# Patient Record
Sex: Female | Born: 1995 | Race: Black or African American | Hispanic: No | Marital: Single | Smoking: Never smoker
Health system: Southern US, Community
[De-identification: ages and names within clinical notes are randomized; demographics above are authoritative.]

---

## 2017-10-13 ENCOUNTER — Encounter (HOSPITAL_COMMUNITY): Payer: Self-pay

## 2017-10-13 DIAGNOSIS — Z5321 Procedure and treatment not carried out due to patient leaving prior to being seen by health care provider: Secondary | ICD-10-CM | POA: Insufficient documentation

## 2017-10-13 DIAGNOSIS — R0602 Shortness of breath: Secondary | ICD-10-CM | POA: Insufficient documentation

## 2017-10-13 LAB — BASIC METABOLIC PANEL
Anion gap: 9 (ref 5–15)
BUN: 10 mg/dL (ref 6–20)
CHLORIDE: 109 mmol/L (ref 101–111)
CO2: 21 mmol/L — AB (ref 22–32)
Calcium: 8.9 mg/dL (ref 8.9–10.3)
Creatinine, Ser: 0.87 mg/dL (ref 0.44–1.00)
GFR calc Af Amer: >60 mL/min (ref >60)
GFR calc non Af Amer: >60 mL/min (ref >60)
GLUCOSE: 98 mg/dL (ref 65–99)
Potassium: 3.6 mmol/L (ref 3.5–5.1)
Sodium: 139 mmol/L (ref 135–145)

## 2017-10-13 LAB — I-STAT BETA HCG BLOOD, ED (MC, WL, AP ONLY)

## 2017-10-13 LAB — CBC
HCT: 34.7 % — ABNORMAL LOW (ref 36.0–46.0)
Hemoglobin: 11.1 g/dL — ABNORMAL LOW (ref 12.0–15.0)
MCH: 30.9 pg (ref 26.0–34.0)
MCHC: 32 g/dL (ref 30.0–36.0)
MCV: 96.7 fL (ref 78.0–100.0)
Platelets: 286 10*3/uL (ref 150–400)
RBC: 3.59 MIL/uL — AB (ref 3.87–5.11)
RDW: 12.7 % (ref 11.5–15.5)
WBC: 5.1 10*3/uL (ref 4.0–10.5)

## 2017-10-13 MED ORDER — ALBUTEROL SULFATE (2.5 MG/3ML) 0.083% IN NEBU: 5.0000 mg | INHALATION_SOLUTION | Freq: Once | RESPIRATORY_TRACT | Status: DC

## 2017-10-13 NOTE — ED Triage Notes (Signed)
Pt doesn't take any birth control

## 2017-10-13 NOTE — ED Triage Notes (Signed)
Pt complains of being short of breath after diving this weekend, the dive center wanted her to come get checked out because of her shortness of breath Pt also complains of left leg pain, ear ringing and some back pain

## 2017-10-14 ENCOUNTER — Emergency Department (HOSPITAL_COMMUNITY)
Admission: EM | Admit: 2017-10-14 | Discharge: 2017-10-14 | Disposition: A | Discharge status: Home / Self Care | Payer: Medicaid - Out of State | Attending: Emergency Medicine | Admitting: Emergency Medicine

## 2017-10-14 NOTE — ED Notes (Signed)
Called pt from lobby for recheck of v/s No response 1st attempt 

## 2017-10-15 ENCOUNTER — Emergency Department (HOSPITAL_COMMUNITY): Payer: Medicaid - Out of State

## 2017-10-15 ENCOUNTER — Emergency Department (HOSPITAL_COMMUNITY)
Admission: EM | Admit: 2017-10-15 | Discharge: 2017-10-15 | Disposition: A | Discharge status: Home / Self Care | Payer: Medicaid - Out of State | Attending: Emergency Medicine | Admitting: Emergency Medicine

## 2017-10-15 ENCOUNTER — Encounter (HOSPITAL_COMMUNITY): Payer: Self-pay | Admitting: *Deleted

## 2017-10-15 DIAGNOSIS — R42 Dizziness and giddiness: Secondary | ICD-10-CM | POA: Insufficient documentation

## 2017-10-15 DIAGNOSIS — M545 Low back pain: Secondary | ICD-10-CM | POA: Diagnosis not present

## 2017-10-15 DIAGNOSIS — M25552 Pain in left hip: Secondary | ICD-10-CM | POA: Insufficient documentation

## 2017-10-15 DIAGNOSIS — R0602 Shortness of breath: Secondary | ICD-10-CM | POA: Diagnosis not present

## 2017-10-15 DIAGNOSIS — R0789 Other chest pain: Secondary | ICD-10-CM | POA: Insufficient documentation

## 2017-10-15 DIAGNOSIS — M25562 Pain in left knee: Secondary | ICD-10-CM | POA: Insufficient documentation

## 2017-10-15 DIAGNOSIS — R600 Localized edema: Secondary | ICD-10-CM | POA: Insufficient documentation

## 2017-10-15 DIAGNOSIS — H9319 Tinnitus, unspecified ear: Secondary | ICD-10-CM | POA: Insufficient documentation

## 2017-10-15 DIAGNOSIS — R079 Chest pain, unspecified: Secondary | ICD-10-CM

## 2017-10-15 LAB — CBC
HCT: 34.1 % — ABNORMAL LOW (ref 36.0–46.0)
Hemoglobin: 11 g/dL — ABNORMAL LOW (ref 12.0–15.0)
MCH: 31.3 pg (ref 26.0–34.0)
MCHC: 32.3 g/dL (ref 30.0–36.0)
MCV: 97.2 fL (ref 78.0–100.0)
PLATELETS: 282 10*3/uL (ref 150–400)
RBC: 3.51 MIL/uL — AB (ref 3.87–5.11)
RDW: 12.7 % (ref 11.5–15.5)
WBC: 4.3 10*3/uL (ref 4.0–10.5)

## 2017-10-15 LAB — COMPREHENSIVE METABOLIC PANEL
ALT: 12 U/L — ABNORMAL LOW (ref 14–54)
AST: 22 U/L (ref 15–41)
Albumin: 3.6 g/dL (ref 3.5–5.0)
Alkaline Phosphatase: 47 U/L (ref 38–126)
Anion gap: 5 (ref 5–15)
BILIRUBIN TOTAL: 0.4 mg/dL (ref 0.3–1.2)
BUN: 10 mg/dL (ref 6–20)
CO2: 26 mmol/L (ref 22–32)
CREATININE: 0.77 mg/dL (ref 0.44–1.00)
Calcium: 8.7 mg/dL — ABNORMAL LOW (ref 8.9–10.3)
Chloride: 110 mmol/L (ref 101–111)
Glucose, Bld: 106 mg/dL — ABNORMAL HIGH (ref 65–99)
Potassium: 4 mmol/L (ref 3.5–5.1)
Sodium: 141 mmol/L (ref 135–145)
TOTAL PROTEIN: 6.6 g/dL (ref 6.5–8.1)

## 2017-10-15 LAB — TROPONIN I

## 2017-10-15 MED ORDER — DIPHENHYDRAMINE HCL 25 MG PO CAPS
25.0000 mg | ORAL_CAPSULE | Freq: Once | ORAL | Status: AC
  Administered 2017-10-15: 25 mg via ORAL
  Filled 2017-10-15: qty 1

## 2017-10-15 MED ORDER — KETOROLAC TROMETHAMINE 30 MG/ML IJ SOLN
30.0000 mg | Freq: Once | INTRAMUSCULAR | Status: AC
  Administered 2017-10-15: 30 mg via INTRAVENOUS
  Filled 2017-10-15: qty 1

## 2017-10-15 MED ORDER — SODIUM CHLORIDE 0.9 % IV SOLN: Freq: Once | INTRAVENOUS | Status: DC

## 2017-10-15 NOTE — ED Triage Notes (Signed)
Pt complains of chest pain, shortness of breath, left leg pain and swelling, head pain. Pt was diving over the weekend and dive center wanted pt to be evaluated. Pt came to ED yesterday, had EKG and blood drawn but left before being seen.

## 2017-10-15 NOTE — ED Provider Notes (Addendum)
[facility]-EMERGENCY DEPT Provider Note   CSN: [id number] Arrival date & time: 10/15/17  1036     History   Chief Complaint Chief Complaint  Patient presents with  . Chest Pain    HPI Bonnie Good is a 22 y.o. female. With no known past medical history presents with sob, left leg pain pain, ear ringing, and back pain. The patient went scuba diving on 4/27 and 4/28 for a total of 4 dives (2 dives each day) that decended below 30 feet. She was being supervised by her diving instructors and to her knowledge her tank and mask were functional. She also states that she feels that she ascended to surface level at appropriate time and was being supervised by her instructors at the time. She stayed at surface level for 10-12min between her dives as instructed.   Within 2 hrs after the dive the patient mentioned that she started to be lightheaded and dizzy. She developed head throbbing which later became a pressure like pain. Later on in the day, she started to get left hip and knee pain that was 4/10 pain and sharp in nature, and worsens when she walks. She also developed substernal chest pain that is 7/10 intensity, nonspecific in nature, worsening with changes in position, and alleviated with deep breaths. She had tinnitus as well. Initially the patient felt that she was just being anxious, but as her symptoms have persisted and have been worsening she called the divers hotline and was recommended to come to the ED to be evaluated.   Presented to ED on 10/13/17, but left before being seen by physician. EKG done at that time did not show any st or t wave changes. No significant electrolyte abnormalities. No hemoconcentration (hb=11.1, hct=34.7).    History reviewed. No pertinent past medical history.  There are no active problems to display for this patient.   History reviewed. No pertinent surgical history.   OB History   None      Home Medications    Prior to  Admission medications   Medication Sig Start Date End Date Taking? Authorizing Provider  BIOTIN PO Take 1 capsule by mouth every 7 (seven) days.   Yes [provider]  ibuprofen (ADVIL,MOTRIN) 200 MG tablet Take 200 mg by mouth every 6 (six) hours as needed for moderate pain.   Yes [provider]    Family History No family history on file.  Social History Social History   Tobacco Use  . Smoking status: Never Smoker  . Smokeless tobacco: Never Used  Substance Use Topics  . Alcohol use: Never    Frequency: Never  . Drug use: Never     Allergies   Patient has no known allergies.   Review of Systems Review of Systems  Constitutional: Positive for fatigue.  HENT: Positive for tinnitus.   Cardiovascular: Positive for chest pain.  Musculoskeletal: Positive for joint swelling.  Neurological: Positive for dizziness and light-headedness.   Physical Exam Updated Vital Signs BP 122/80 (BP Location: Left Arm)   Pulse (!) 102   Temp 98.3 F (36.8 C) (Oral)   Resp 18   LMP 10/12/2017   SpO2 100%   Physical Exam  Constitutional: She appears well-developed and well-nourished. She does not appear ill.  HENT:  Head: Normocephalic and atraumatic.  Eyes: Pupils are equal, round, and reactive to light.  Cardiovascular: Normal pulses. Exam reveals no S3 and no S4.  No murmur heard. Pulmonary/Chest: Effort normal and breath sounds  normal. No accessory muscle usage. No respiratory distress.  Musculoskeletal:       Right lower leg: She exhibits no tenderness and no edema.       Left lower leg: She exhibits edema (posterior left knee joint).  Neurological:  5/5 muscle strength in bilateral upper and lower extremity  Skin: No erythema.  Psychiatric: She has a normal mood and affect. Her behavior is normal. Her mood appears not anxious. She is not agitated.    ED Treatments / Results  Labs (all labs ordered are listed, but only abnormal results are  displayed) Labs Reviewed  BASIC METABOLIC PANEL  CBC  TROPONIN I    EKG None  Radiology No results found.  Procedures Procedures (including critical care time)  Medications Ordered in ED Medications - No data to display   Initial Impression / Assessment and Plan / ED Course  I have reviewed the triage vital signs and the nursing notes.  Pertinent labs & imaging results that were available during my care of the patient were reviewed by me and considered in my medical decision making (see chart for details).  Patient has tinnitus, lightheadedness/dizziness, musculoskeletal/joint pain (left hip and left knee), headache, chest pain, shortness of breath that all meet criteria for a clinical diagnosis of decompression sickness. She scored a 5 based on [place] [employer] (SANDHOG) scale for decompression sickness.  Called [facility] and spoke to Dr. Ulice Brilliant about treatment plan. She mentioned that the patient likely just has some increased nitrogen which should be alleviated. Did not believe that the patient truly has decompression sickness. Was instructed to give 100% oxygen via non-rebreather and do tandem walking test before discharging patient.  -CBC did not show any hemoconcentration (Hb=11.0, hct=34.1) -Placed on 100% oxygen via non-rebreather -Chest xray shows no acute cardiopulmonary abnormalities.  -Left knee xray without any effusion, fracture, dislocation -Left hip xray normal without any effusion, fraction or dislocation -CT head no acute intracranial abnormalities -Passed tandem walking test forwards and backwards -Gave toradol , benadryl   Final Clinical Impressions(s) / ED Diagnoses   Final diagnoses:  None    ED Discharge Orders    None       Lorenso Courier, MD 10/15/17 1851    Lorenso Courier, MD 10/15/17 2001    Marily Memos, MD 10/15/17 2326

## 2017-10-15 NOTE — ED Notes (Signed)
Pt aware we are waiting for EDP to come discuss test results and discharge or POC

## 2017-10-15 NOTE — ED Provider Notes (Signed)
I saw and evaluated the patient, reviewed the resident's note and I agree with the findings and plan with the following exceptions.   22 yo F w/ 4 dives over the weekend >30 feet subsequently has had headache, neck pain, chest pain w/ sob, tinnitus, left knee pain/swelling since that time. Hasn't taken anything for pain. No h/o cad, PE, dvt,aneurysms.  Exam with slight tachycardia, mild swelling around left knee. Neuro intact. Abdomen benign.  Has many criteria for possible decompression sickness, will consult hyperbaric chamber. Will ct/ecg/cxr/labs while waiting consultation.    EKG Interpretation  Date/Time:  Wednesday Oct 15 2017 16:50:21 EDT Ventricular Rate:  52 PR Interval:    QRS Duration: 97 QT Interval:  426 QTC Calculation: 397 R Axis:   52 Text Interpretation:  Sinus rhythm Probable left atrial enlargement No significant change since last tracing Confirmed by Marily Memos [phone]) on 10/15/2017 6:37:02 PM         Deette Revak, Barbara Cower, MD 10/15/17 2326

## 2017-10-15 NOTE — Discharge Instructions (Addendum)
It was a pleasure to take care of you Bonnie Good. You were evaluated for decompensation sickness after your recent scuba diving. Xrays of your left hip and left knee, CT of head, and chest xray were all normal. Please continue to monitor your symptoms and if you continue to have any issues please call the [facility].

## 2017-10-15 NOTE — ED Notes (Signed)
Pt started on 100% NRB as ordered. No s/s of distress or complaints at present time

## 2019-08-02 IMAGING — CT CT HEAD W/O CM
3 series · 16 of 47 positions shown, 19 images · non-contrast
Comparison: None.

CLINICAL DATA: Headache

EXAM:
CT HEAD WITHOUT CONTRAST
TECHNIQUE: Contiguous axial images were obtained from the base of the skull
through the vertex without intravenous contrast.

[Series 2: head wo · axial · 0.41mm/px · z∈[-161,-26]mm · 10 of 33 slices shown, 13 images]
[im 3/33  brain]
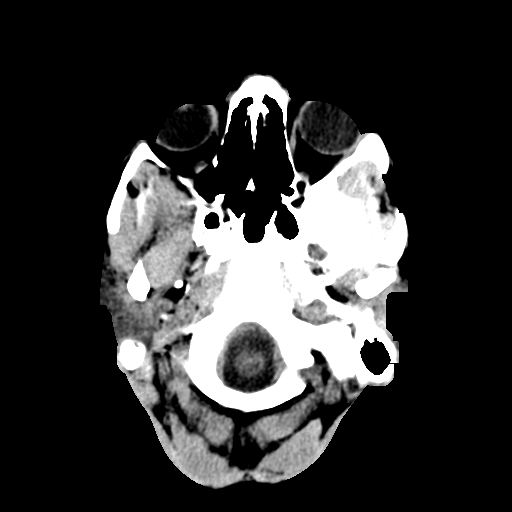
[im 3/33  bone]
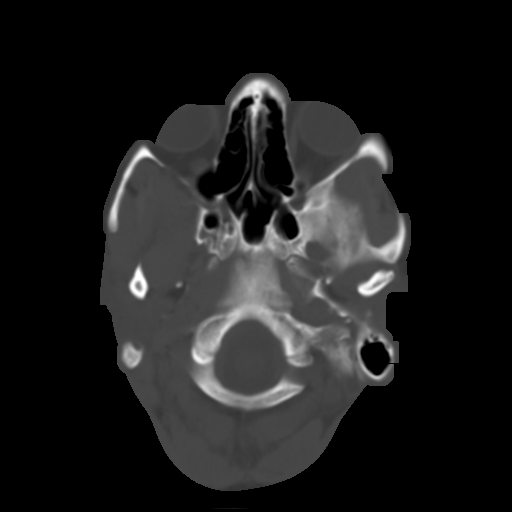
[im 6/33  brain]
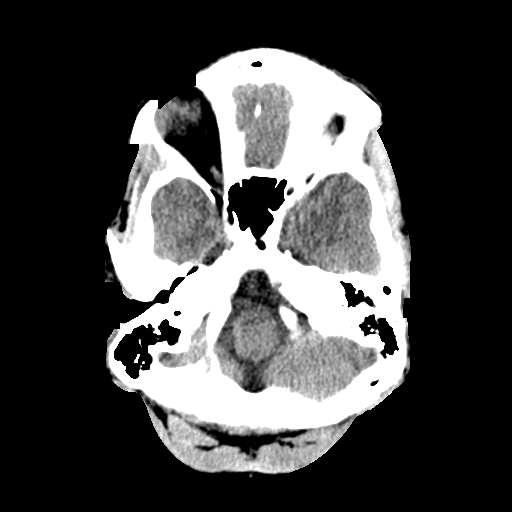
[im 9/33  brain]
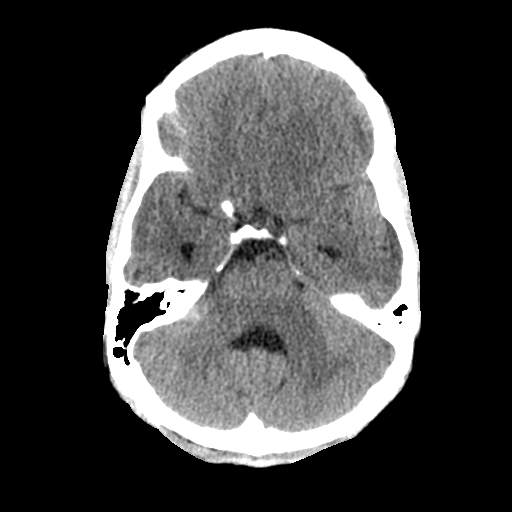
[im 12/33  brain]
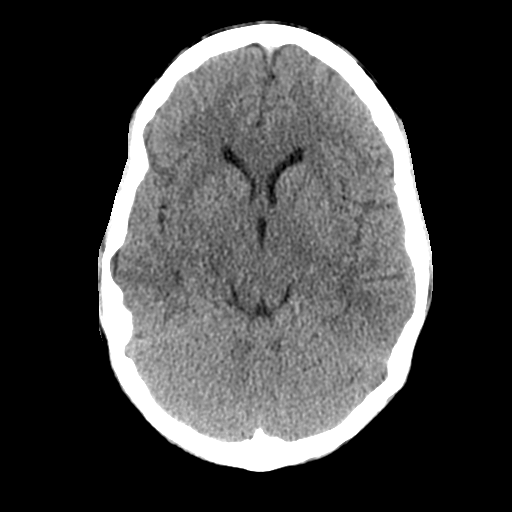
[im 15/33  brain]
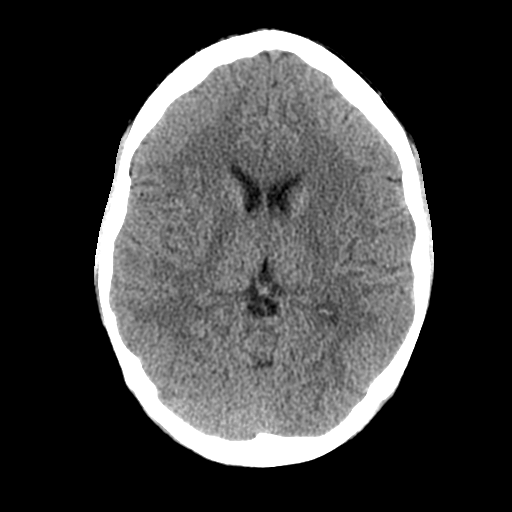
[im 15/33  bone]
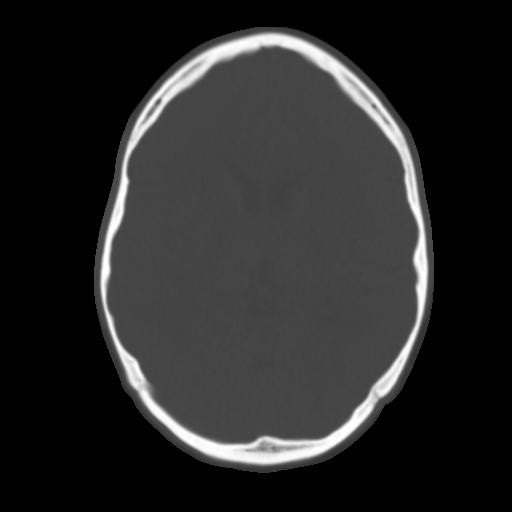
[im 18/33  brain]
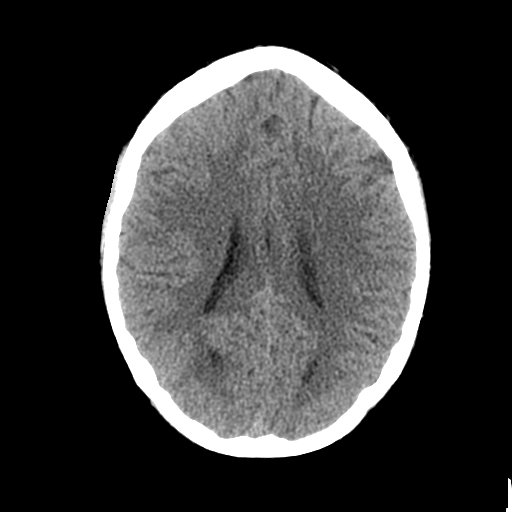
[im 21/33  brain]
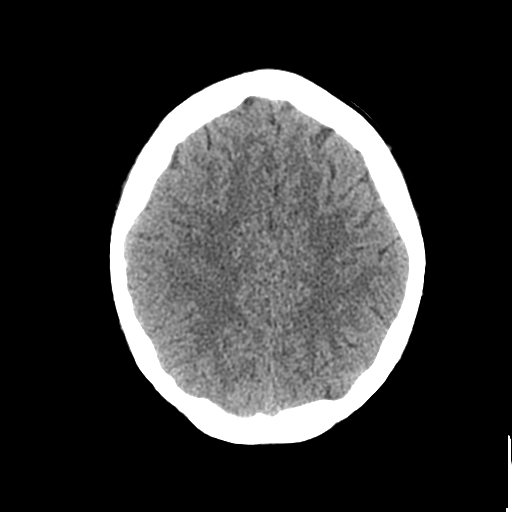
[im 25/33  brain]
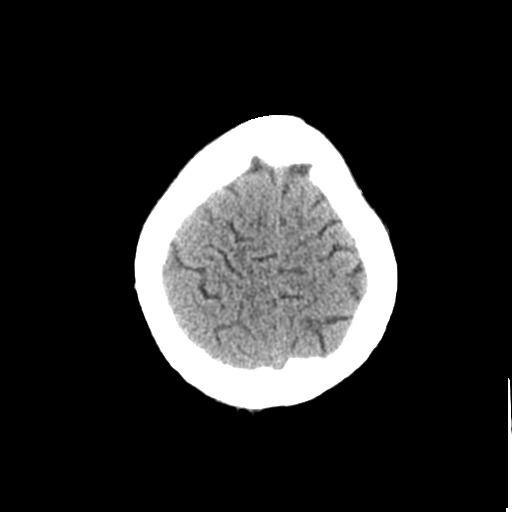
[im 27/33  brain]
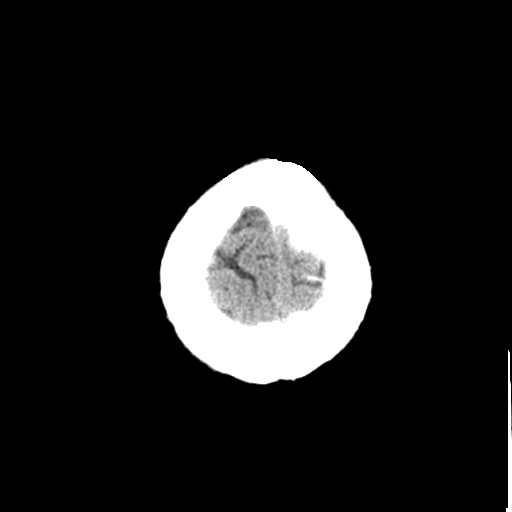
[im 27/33  bone]
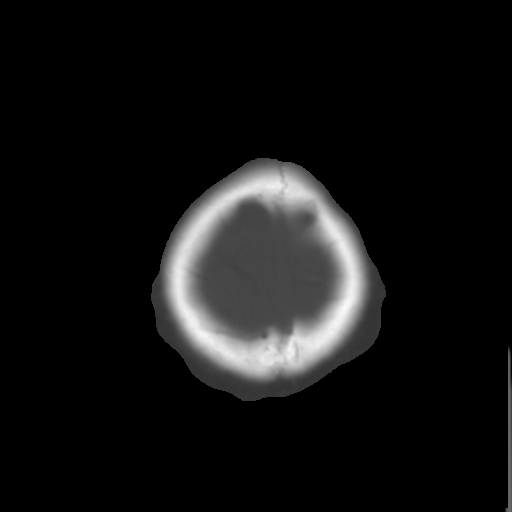
[im 30/33  brain]
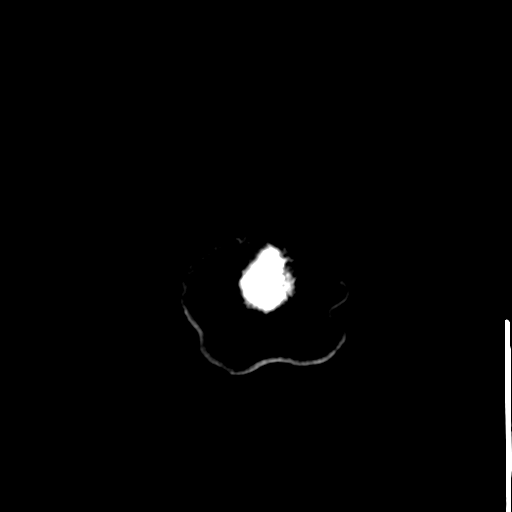

[Series 5: coronal soft tissue · coronal · 0.31mm/px · 3 of 69 slices shown]
[im 23/69  brain]
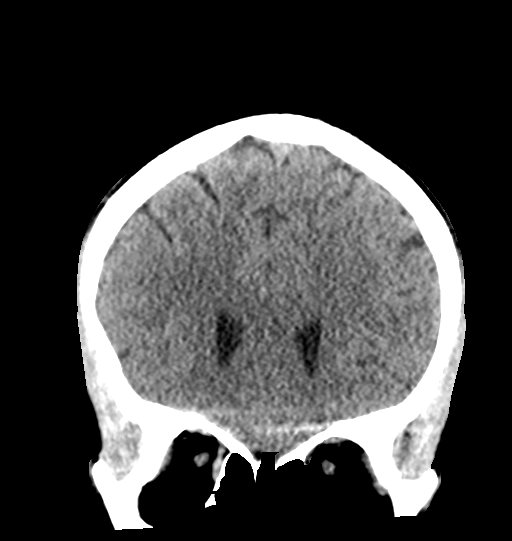
[im 31/69  brain]
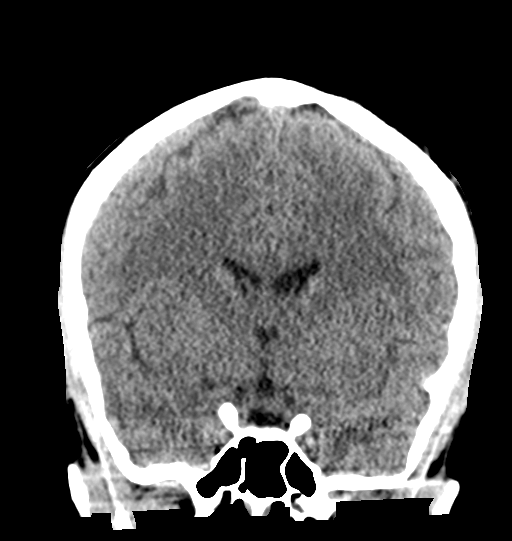
[im 38/69  brain]
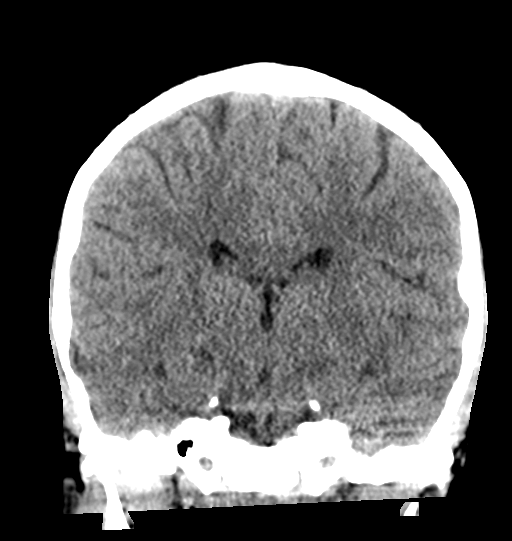

[Series 6: sagittal soft tissue · sagittal · 0.33mm/px · 3 of 54 slices shown]
[im 18/54  brain]
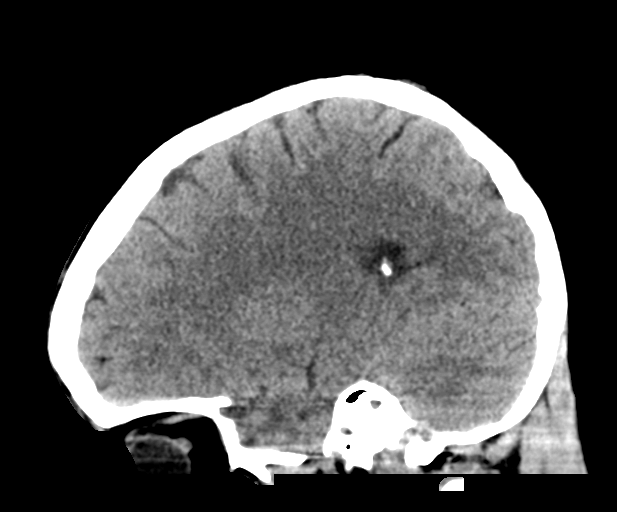
[im 27/54  brain]
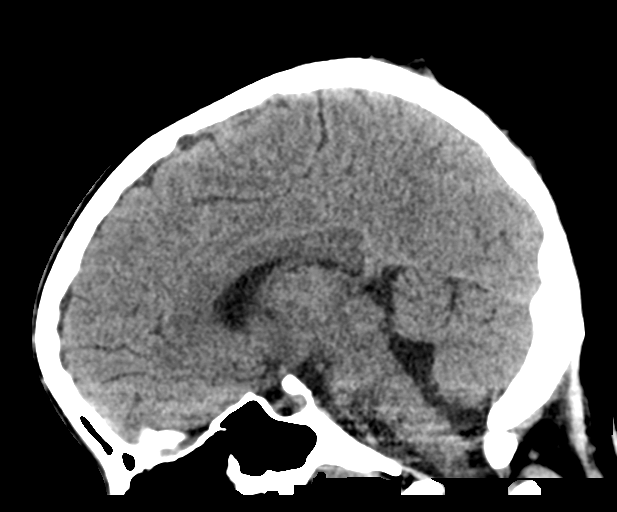
[im 36/54  brain]
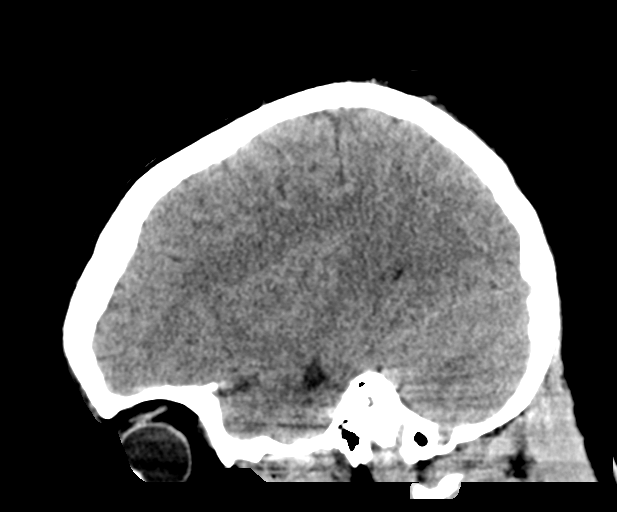

[16 of 47 positions shown; findings below may reference images not displayed]

FINDINGS: Brain: No evidence of acute infarction, hemorrhage, hydrocephalus,
extra-axial collection or mass lesion/mass effect.

Vascular: No hyperdense vessel or unexpected calcification.

Skull: Normal. Negative for fracture or focal lesion.

Sinuses/Orbits: The visualized paranasal sinuses are essentially
clear. The mastoid air cells are unopacified.

Other: None.
IMPRESSION: Normal head CT.
